# Patient Record
Sex: Male | Born: 1965 | Race: Black or African American | Hispanic: No | Marital: Married | Smoking: Never smoker
Health system: Southern US, Community
[De-identification: ages and names within clinical notes are randomized; demographics above are authoritative.]

## PROBLEM LIST (undated history)

## (undated) DIAGNOSIS — K449 Diaphragmatic hernia without obstruction or gangrene: Secondary | ICD-10-CM

---

## 2007-08-22 ENCOUNTER — Emergency Department (HOSPITAL_BASED_OUTPATIENT_CLINIC_OR_DEPARTMENT_OTHER): Admission: EM | Admit: 2007-08-22 | Discharge: 2007-08-22 | Payer: Self-pay | Admitting: Emergency Medicine

## 2010-11-16 LAB — CBC
HCT: 42.5
Hemoglobin: 13.5
MCHC: 31.7
MCV: 70.8 — ABNORMAL LOW
Platelets: 192
RBC: 6.01 — ABNORMAL HIGH
RDW: 13.1
WBC: 6

## 2010-11-16 LAB — BASIC METABOLIC PANEL
CO2: 28
GFR calc Af Amer: >60
Glucose, Bld: 99
Potassium: 3.9
Sodium: 141

## 2010-11-16 LAB — DIFFERENTIAL
Basophils Absolute: 0
Basophils Relative: 1
Eosinophils Absolute: 0.1
Eosinophils Relative: 2
Lymphocytes Relative: 59 — ABNORMAL HIGH
Lymphs Abs: 3.6
Monocytes Absolute: 0.6
Monocytes Relative: 10
Neutro Abs: 1.7
Neutrophils Relative %: 29 — ABNORMAL LOW

## 2010-11-16 LAB — BASIC METABOLIC PANEL WITH GFR
BUN: 12
Calcium: 9.5
Chloride: 103
Creatinine, Ser: 1.3
GFR calc non Af Amer: >60

## 2010-11-16 LAB — POCT CARDIAC MARKERS
CKMB, poc: 1.9
Myoglobin, poc: 61.2
Operator id: 5506
Troponin i, poc: <0.05

## 2013-02-27 ENCOUNTER — Encounter (HOSPITAL_BASED_OUTPATIENT_CLINIC_OR_DEPARTMENT_OTHER): Payer: Self-pay | Admitting: Emergency Medicine

## 2013-02-27 ENCOUNTER — Emergency Department (HOSPITAL_BASED_OUTPATIENT_CLINIC_OR_DEPARTMENT_OTHER)
Admission: EM | Admit: 2013-02-27 | Discharge: 2013-02-27 | Disposition: A | Discharge status: Home / Self Care | Payer: BC Managed Care – PPO | Attending: Emergency Medicine | Admitting: Emergency Medicine

## 2013-02-27 DIAGNOSIS — J029 Acute pharyngitis, unspecified: Secondary | ICD-10-CM

## 2013-02-27 DIAGNOSIS — J329 Chronic sinusitis, unspecified: Secondary | ICD-10-CM | POA: Insufficient documentation

## 2013-02-27 MED ORDER — AMOXICILLIN 500 MG PO CAPS: 500.0000 mg | ORAL_CAPSULE | Freq: Three times a day (TID) | ORAL | Status: AC

## 2013-02-27 NOTE — Discharge Instructions (Signed)
Viral and Bacterial Pharyngitis  Pharyngitis is soreness (inflammation) or infection of the pharynx. It is also called a sore throat.  CAUSES   Most sore throats are caused by viruses and are part of a cold. However, some sore throats are caused by strep and other bacteria. Sore throats can also be caused by post nasal drip from draining sinuses, allergies and sometimes from sleeping with an open mouth. Infectious sore throats can be spread from person to person by coughing, sneezing and sharing cups or eating utensils.  TREATMENT   Sore throats that are viral usually last 3-4 days. Viral illness will get better without medications (antibiotics). Strep throat and other bacterial infections will usually begin to get better about 24-48 hours after you begin to take antibiotics.  HOME CARE INSTRUCTIONS    If the caregiver feels there is a bacterial infection or if there is a positive strep test, they will prescribe an antibiotic. The full course of antibiotics must be taken. If the full course of antibiotic is not taken, you or your child may become ill again. If you or your child has strep throat and do not finish all of the medication, serious heart or kidney diseases may develop.   Drink enough water and fluids to keep your urine clear or pale yellow.   Only take over-the-counter or prescription medicines for pain, discomfort or fever as directed by your caregiver.   Get lots of rest.   Gargle with salt water ( tsp. of salt in a glass of water) as often as every 1-2 hours as you need for comfort.   Hard candies may soothe the throat if individual is not at risk for choking. Throat sprays or lozenges may also be used.  SEEK MEDICAL CARE IF:    Large, tender lumps in the neck develop.   A rash develops.   Green, yellow-brown or bloody sputum is coughed up.   Your baby is older than 3 months with a rectal temperature of 100.5 F (38.1 C) or higher for more than 1 day.  SEEK IMMEDIATE MEDICAL CARE IF:    A  stiff neck develops.   You or your child are drooling or unable to swallow liquids.   You or your child are vomiting, unable to keep medications or liquids down.   You or your child has severe pain, unrelieved with recommended medications.   You or your child are having difficulty breathing (not due to stuffy nose).   You or your child are unable to fully open your mouth.   You or your child develop redness, swelling, or severe pain anywhere on the neck.   You have a fever.   Your baby is older than 3 months with a rectal temperature of 102 F (38.9 C) or higher.   Your baby is 3 months old or younger with a rectal temperature of 100.4 F (38 C) or higher.  MAKE SURE YOU:    Understand these instructions.   Will watch your condition.   Will get help right away if you are not doing well or get worse.  Document Released: 02/05/2005 Document Revised: 04/30/2011 Document Reviewed: 05/05/2007  [employer] Patient Information 2014 [employer]  Sinusitis  Sinusitis is redness, soreness, and swelling (inflammation) of the paranasal sinuses. Paranasal sinuses are air pockets within the bones of your face (beneath the eyes, the middle of the forehead, or above the eyes). In healthy paranasal sinuses, mucus is able to drain out, and air is able to   circulate through them by way of your nose. However, when your paranasal sinuses are inflamed, mucus and air can become trapped. This can allow bacteria and other germs to grow and cause infection.  Sinusitis can develop quickly and last only a short time (acute) or continue over a long period (chronic). Sinusitis that lasts for more than 12 weeks is considered chronic.   CAUSES   Causes of sinusitis include:   Allergies.   Structural abnormalities, such as displacement of the cartilage that separates your nostrils (deviated septum), which can decrease the air flow through your nose and sinuses and affect sinus drainage.   Functional abnormalities, such as when the  small hairs (cilia) that line your sinuses and help remove mucus do not work properly or are not present.  SYMPTOMS   Symptoms of acute and chronic sinusitis are the same. The primary symptoms are pain and pressure around the affected sinuses. Other symptoms include:   Upper toothache.   Earache.   Headache.   Bad breath.   Decreased sense of smell and taste.   A cough, which worsens when you are lying flat.   Fatigue.   Fever.   Thick drainage from your nose, which often is green and may contain pus (purulent).   Swelling and warmth over the affected sinuses.  DIAGNOSIS   Your caregiver will perform a physical exam. During the exam, your caregiver may:   Look in your nose for signs of abnormal growths in your nostrils (nasal polyps).   Tap over the affected sinus to check for signs of infection.   View the inside of your sinuses (endoscopy) with a special imaging device with a light attached (endoscope), which is inserted into your sinuses.  If your caregiver suspects that you have chronic sinusitis, one or more of the following tests may be recommended:   Allergy tests.   Nasal culture A sample of mucus is taken from your nose and sent to a lab and screened for bacteria.   Nasal cytology A sample of mucus is taken from your nose and examined by your caregiver to determine if your sinusitis is related to an allergy.  TREATMENT   Most cases of acute sinusitis are related to a viral infection and will resolve on their own within 10 days. Sometimes medicines are prescribed to help relieve symptoms (pain medicine, decongestants, nasal steroid sprays, or saline sprays).   However, for sinusitis related to a bacterial infection, your caregiver will prescribe antibiotic medicines. These are medicines that will help kill the bacteria causing the infection.   Rarely, sinusitis is caused by a fungal infection. In theses cases, your caregiver will prescribe antifungal medicine.  For some cases of chronic  sinusitis, surgery is needed. Generally, these are cases in which sinusitis recurs more than 3 times per year, despite other treatments.  HOME CARE INSTRUCTIONS    Drink plenty of water. Water helps thin the mucus so your sinuses can drain more easily.   Use a humidifier.   Inhale steam 3 to 4 times a day (for example, sit in the bathroom with the shower running).   Apply a warm, moist washcloth to your face 3 to 4 times a day, or as directed by your caregiver.   Use saline nasal sprays to help moisten and clean your sinuses.   Take over-the-counter or prescription medicines for pain, discomfort, or fever only as directed by your caregiver.  SEEK IMMEDIATE MEDICAL CARE IF:   You have increasing pain or

## 2013-02-27 NOTE — ED Notes (Signed)
Pt amb to room 11 with quick steady gait in nad. Pt reports sore throat x Tuesday. Denies any cough or other c/o.

## 2013-02-27 NOTE — ED Provider Notes (Signed)
CSN: [id number]     Arrival date & time 02/27/13  0857 History   First MD Initiated Contact with Patient 02/27/13 0914     Chief Complaint  Patient presents with  . Sore Throat    HPI  Patient presents with a sore throat. Right sinus pressure and drainage last 2 days he states it is set up last night because of the drainage into his throat. Slight cough, no shortness of breath. Although he does cough at night he does not cough during the day. No fevers. No myalgias. No GI complaints. Not improving with over-the-counter medicines at home  History reviewed. No pertinent past medical history. History reviewed. No pertinent past surgical history. History reviewed. No pertinent family history. History  Substance Use Topics  . Smoking status: Never Smoker   . Smokeless tobacco: Not on file  . Alcohol Use: Not on file    Review of Systems  Constitutional: Negative for fever, chills, diaphoresis, appetite change and fatigue.  HENT: Positive for postnasal drip, rhinorrhea, sinus pressure and sore throat. Negative for ear pain, mouth sores and trouble swallowing.   Eyes: Negative for visual disturbance.  Respiratory: Negative for cough, chest tightness, shortness of breath and wheezing.   Cardiovascular: Negative for chest pain.  Gastrointestinal: Negative for nausea, vomiting, abdominal pain, diarrhea and abdominal distention.  Endocrine: Negative for polydipsia, polyphagia and polyuria.  Genitourinary: Negative for dysuria, frequency and hematuria.  Musculoskeletal: Negative for gait problem.  Skin: Negative for color change, pallor and rash.  Neurological: Negative for dizziness, syncope, light-headedness and headaches.  Hematological: Does not bruise/bleed easily.  Psychiatric/Behavioral: Negative for behavioral problems and confusion.    Allergies  Review of patient's allergies indicates no known allergies.  Home Medications   Current Outpatient Rx  Name  Route  Sig  Dispense   Refill  . amoxicillin (AMOXIL) 500 MG capsule   Oral   Take 1 capsule (500 mg total) by mouth 3 (three) times daily.   30 capsule   0    BP 152/99  Pulse 92  Temp(Src) 98.8 F (37.1 C) (Oral)  Resp 18  SpO2 100% Physical Exam  Constitutional: He is oriented to person, place, and time. He appears well-developed and well-nourished. No distress.  HENT:  Head: Normocephalic.  Tender over the right maxilla. Congestive nares. Slight exudate on the bilateral tonsils although no tonsillar hypertrophy. No adenopathy in the neck. Normal TMs.  Eyes: Conjunctivae are normal. Pupils are equal, round, and reactive to light. No scleral icterus.  Neck: Normal range of motion. Neck supple. No thyromegaly present.  Cardiovascular: Normal rate and regular rhythm.  Exam reveals no gallop and no friction rub.   No murmur heard. Pulmonary/Chest: Effort normal and breath sounds normal. No respiratory distress. He has no wheezes. He has no rales.  Abdominal: Soft. Bowel sounds are normal. He exhibits no distension. There is no tenderness. There is no rebound.  Musculoskeletal: Normal range of motion.  Neurological: He is alert and oriented to person, place, and time.  Skin: Skin is warm and dry. No rash noted.  Psychiatric: He has a normal mood and affect. His behavior is normal.    ED Course  Procedures (including critical care time) Labs Review Labs Reviewed - No data to display Imaging Review No results found.  EKG Interpretation   None       MDM   1. Pharyngitis   2. Sinusitis    Clinical symptoms consistent with a sinusitis. He does have exudate  in his throat. Plan will be pan covered with amoxicillin. Continue antihistamines for drainage.    Rolland Porter, MD 02/27/13 [phone]

## 2014-10-25 ENCOUNTER — Encounter (HOSPITAL_BASED_OUTPATIENT_CLINIC_OR_DEPARTMENT_OTHER): Payer: Self-pay | Admitting: *Deleted

## 2014-10-25 ENCOUNTER — Emergency Department (HOSPITAL_BASED_OUTPATIENT_CLINIC_OR_DEPARTMENT_OTHER)
Admission: EM | Admit: 2014-10-25 | Discharge: 2014-10-25 | Disposition: A | Discharge status: Home / Self Care | Payer: Managed Care, Other (non HMO) | Attending: Emergency Medicine | Admitting: Emergency Medicine

## 2014-10-25 ENCOUNTER — Emergency Department (HOSPITAL_BASED_OUTPATIENT_CLINIC_OR_DEPARTMENT_OTHER): Payer: Managed Care, Other (non HMO)

## 2014-10-25 ENCOUNTER — Other Ambulatory Visit: Payer: Self-pay

## 2014-10-25 DIAGNOSIS — R0789 Other chest pain: Secondary | ICD-10-CM | POA: Diagnosis not present

## 2014-10-25 DIAGNOSIS — R000 Tachycardia, unspecified: Secondary | ICD-10-CM | POA: Diagnosis not present

## 2014-10-25 DIAGNOSIS — Z8719 Personal history of other diseases of the digestive system: Secondary | ICD-10-CM | POA: Insufficient documentation

## 2014-10-25 DIAGNOSIS — Z79899 Other long term (current) drug therapy: Secondary | ICD-10-CM | POA: Insufficient documentation

## 2014-10-25 DIAGNOSIS — R079 Chest pain, unspecified: Secondary | ICD-10-CM | POA: Diagnosis present

## 2014-10-25 HISTORY — DX: Diaphragmatic hernia without obstruction or gangrene: K44.9

## 2014-10-25 LAB — CBC WITH DIFFERENTIAL/PLATELET
BASOS ABS: 0 10*3/uL (ref 0.0–0.1)
BASOS PCT: 0 % (ref 0–1)
EOS ABS: 0.1 10*3/uL (ref 0.0–0.7)
Eosinophils Relative: 2 % (ref 0–5)
HEMATOCRIT: 43.1 % (ref 39.0–52.0)
HEMOGLOBIN: 14.3 g/dL (ref 13.0–17.0)
Lymphocytes Relative: 55 % — ABNORMAL HIGH (ref 12–46)
Lymphs Abs: 3.6 10*3/uL (ref 0.7–4.0)
MCH: 22.3 pg — AB (ref 26.0–34.0)
MCHC: 33.2 g/dL (ref 30.0–36.0)
MCV: 67.3 fL — AB (ref 78.0–100.0)
MONO ABS: 0.8 10*3/uL (ref 0.1–1.0)
Monocytes Relative: 12 % (ref 3–12)
NEUTROS ABS: 2 10*3/uL (ref 1.7–7.7)
Neutrophils Relative %: 31 % — ABNORMAL LOW (ref 43–77)
Platelets: 225 10*3/uL (ref 150–400)
RBC: 6.4 MIL/uL — ABNORMAL HIGH (ref 4.22–5.81)
RDW: 15.2 % (ref 11.5–15.5)
WBC: 6.5 10*3/uL (ref 4.0–10.5)

## 2014-10-25 LAB — COMPREHENSIVE METABOLIC PANEL
ALK PHOS: 75 U/L (ref 38–126)
ALT: 41 U/L (ref 17–63)
AST: 28 U/L (ref 15–41)
Albumin: 4.7 g/dL (ref 3.5–5.0)
Anion gap: 11 (ref 5–15)
BILIRUBIN TOTAL: 0.6 mg/dL (ref 0.3–1.2)
BUN: 17 mg/dL (ref 6–20)
CALCIUM: 9.9 mg/dL (ref 8.9–10.3)
CO2: 29 mmol/L (ref 22–32)
CREATININE: 1.59 mg/dL — AB (ref 0.61–1.24)
Chloride: 98 mmol/L — ABNORMAL LOW (ref 101–111)
GFR, EST AFRICAN AMERICAN: 57 mL/min — AB (ref >60)
GFR, EST NON AFRICAN AMERICAN: 49 mL/min — AB (ref >60)
Glucose, Bld: 114 mg/dL — ABNORMAL HIGH (ref 65–99)
Potassium: 3.2 mmol/L — ABNORMAL LOW (ref 3.5–5.1)
Sodium: 138 mmol/L (ref 135–145)
Total Protein: 8.6 g/dL — ABNORMAL HIGH (ref 6.5–8.1)

## 2014-10-25 LAB — TROPONIN I: Troponin I: <0.03 ng/mL (ref <0.031)

## 2014-10-25 LAB — D-DIMER, QUANTITATIVE (NOT AT ARMC)

## 2014-10-25 MED ORDER — NAPROXEN 500 MG PO TABS: 500.0000 mg | ORAL_TABLET | Freq: Two times a day (BID) | ORAL | Status: DC

## 2014-10-25 NOTE — ED Provider Notes (Signed)
CSN: [id number]     Arrival date & time 10/25/14  5284 History   This chart was scribed for Paul Octave, MD by Murriel Hopper, ED Scribe. This patient was seen in room MH09/MH09 and the patient's care was started at 7:17 PM.  Chief Complaint  Patient presents with  . Chest Pain     The history is provided by the patient. No language interpreter was used.   HPI Comments: Paul Mercer is a 49 y.o. male who presents to the Emergency Department complaining of intermittent central chest pain that has been present for a few days. Pt states that when he moves he begins to have pain and states that it lasts for a minute or two. Pt denies SOB, nausea, vomiting. Pt denies taking any medications for his symptoms. Pt denies hx of heart problems, recent travel, hx of MI. Pt states he had a stress test for his heart 7-8 years ago.    Past Medical History  Diagnosis Date  . Hiatal hernia    History reviewed. No pertinent past surgical history. History reviewed. No pertinent family history. Social History  Substance Use Topics  . Smoking status: Never Smoker   . Smokeless tobacco: None  . Alcohol Use: No    Review of Systems  A complete 10 system review of systems was obtained and all systems are negative except as noted in the HPI and PMH.    Allergies  Review of patient's allergies indicates no known allergies.  Home Medications   Prior to Admission medications   Medication Sig Start Date End Date Taking? Authorizing Provider  chlorthalidone (HYGROTON) 25 MG tablet Take 25 mg by mouth daily.   Yes Historical Provider, MD  naproxen (NAPROSYN) 500 MG tablet Take 1 tablet (500 mg total) by mouth 2 (two) times daily. 10/25/14   Paul Octave, MD   BP 128/88 mmHg  Pulse 86  Temp(Src) 98.7 F (37.1 C) (Oral)  Resp 18  Ht 5\' 10"  (1.778 m)  Wt 230 lb (104.327 kg)  BMI 33.00 kg/m2  SpO2 98% Physical Exam  Constitutional: He is oriented to person, place, and time. He appears  well-developed and well-nourished. No distress.  HENT:  Head: Normocephalic and atraumatic.  Mouth/Throat: Oropharynx is clear and moist. No oropharyngeal exudate.  Eyes: Conjunctivae and EOM are normal. Pupils are equal, round, and reactive to light.  Neck: Normal range of motion. Neck supple.  No meningismus.  Cardiovascular: Regular rhythm, normal heart sounds and intact distal pulses.   No murmur heard. Tachycardic   Pulmonary/Chest: Effort normal and breath sounds normal. No respiratory distress.  Abdominal: Soft. There is no tenderness. There is no rebound and no guarding.  Musculoskeletal: Normal range of motion. He exhibits no edema or tenderness.  Reproducible chest wall tenderness to left sternal mid-junction that doesn't worsen with arm movement    Neurological: He is alert and oriented to person, place, and time. No cranial nerve deficit. He exhibits normal muscle tone. Coordination normal.  No ataxia on finger to nose bilaterally. No pronator drift. 5/5 strength throughout. CN 2-12 intact. Negative Romberg. Equal grip strength. Sensation intact. Gait is normal.   Skin: Skin is warm.  Psychiatric: He has a normal mood and affect. His behavior is normal.  Nursing note and vitals reviewed.   ED Course  Procedures (including critical care time)  DIAGNOSTIC STUDIES: Oxygen Saturation is 99% on room air, normal by my interpretation.    COORDINATION OF CARE: 7:22 PM Discussed treatment plan with  pt at bedside and pt agreed to plan.   Labs Review Labs Reviewed  CBC WITH DIFFERENTIAL/PLATELET - Abnormal; Notable for the following:    RBC 6.40 (*)    MCV 67.3 (*)    MCH 22.3 (*)    Neutrophils Relative % 31 (*)    Lymphocytes Relative 55 (*)    All other components within normal limits  COMPREHENSIVE METABOLIC PANEL - Abnormal; Notable for the following:    Potassium 3.2 (*)    Chloride 98 (*)    Glucose, Bld 114 (*)    Creatinine, Ser 1.59 (*)    Total Protein 8.6  (*)    GFR calc non Af Amer 49 (*)    GFR calc Af Amer 57 (*)    All other components within normal limits  TROPONIN I  D-DIMER, QUANTITATIVE (NOT AT [facility])  TROPONIN I    Imaging Review Dg Chest 2 View  10/25/2014   CLINICAL DATA:  Chest pain for several days.  Initial encounter  EXAM: CHEST  2 VIEW  COMPARISON:  None.  FINDINGS: Normal mediastinum and cardiac silhouette. Normal pulmonary vasculature. No evidence of effusion, infiltrate, or pneumothorax. No acute bony abnormality.  IMPRESSION: No acute cardiopulmonary process.   Electronically Signed   By: Genevive Bi M.D.   On: 10/25/2014 20:34   I have personally reviewed and evaluated these images and lab results as part of my medical decision-making.   EKG Interpretation   Date/Time:  Monday October 25 2014 19:01:31 EDT Ventricular Rate:  115 PR Interval:  148 QRS Duration: 86 QT Interval:  306 QTC Calculation: 423 R Axis:   71 Text Interpretation:  Sinus tachycardia Nonspecific T wave abnormality  Abnormal ECG Nonspecific ST abnormality Confirmed by Manus Gunning  MD, Laverle Pillard  ([id number]) on 10/25/2014 7:07:24 PM      MDM   Final diagnoses:  Atypical chest pain   3 days of intermittent left-sided chest pain lasting a few seconds at a time. Worse with palpation and movement. No shortness of breath, nausea, vomiting, cough or fever.  EKG with nonspecific ST changes. Patient reports negative stress test about 8 years ago.  Pain reproducible and atypical for ACS. Troponin is negative. D-dimer is negative. Cr slightly worse than previous.  Troponin negative 2. Chest x-ray negative. Pain is intermittent and reproducible and worse with movement and palpation. Low suspicion for ACS.  Patient would benefit from outpatient stress test and referred to cardiology. Doubt ACS, PE, aortic dissection, pneumothorax or pneumonia.  I personally performed the services described in this documentation, which was scribed in my presence. The  recorded information has been reviewed and is accurate.    Paul Octave, MD 10/25/14 [phone]

## 2014-10-25 NOTE — Discharge Instructions (Signed)
Chest Pain (Nonspecific) There is no evidence of heart attack or blood clot in the lung. Followup with the cardiologist for a stress test. Return to the ED if you develop new or worsening symptoms. It is often hard to give a specific diagnosis for the cause of chest pain. There is always a chance that your pain could be related to something serious, such as a heart attack or a blood clot in the lungs. You need to follow up with your health care provider for further evaluation. CAUSES   Heartburn.  Pneumonia or bronchitis.  Anxiety or stress.  Inflammation around your heart (pericarditis) or lung (pleuritis or pleurisy).  A blood clot in the lung.  A collapsed lung (pneumothorax). It can develop suddenly on its own (spontaneous pneumothorax) or from trauma to the chest.  Shingles infection (herpes zoster virus). The chest wall is composed of bones, muscles, and cartilage. Any of these can be the source of the pain.  The bones can be bruised by injury.  The muscles or cartilage can be strained by coughing or overwork.  The cartilage can be affected by inflammation and become sore (costochondritis). DIAGNOSIS  Lab tests or other studies may be needed to find the cause of your pain. Your health care provider may have you take a test called an ambulatory electrocardiogram (ECG). An ECG records your heartbeat patterns over a 24-hour period. You may also have other tests, such as:  Transthoracic echocardiogram (TTE). During echocardiography, sound waves are used to evaluate how blood flows through your heart.  Transesophageal echocardiogram (TEE).  Cardiac monitoring. This allows your health care provider to monitor your heart rate and rhythm in real time.  Holter monitor. This is a portable device that records your heartbeat and can help diagnose heart arrhythmias. It allows your health care provider to track your heart activity for several days, if needed.  Stress tests by exercise or  by giving medicine that makes the heart beat faster. TREATMENT   Treatment depends on what may be causing your chest pain. Treatment may include:  Acid blockers for heartburn.  Anti-inflammatory medicine.  Pain medicine for inflammatory conditions.  Antibiotics if an infection is present.  You may be advised to change lifestyle habits. This includes stopping smoking and avoiding alcohol, caffeine, and chocolate.  You may be advised to keep your head raised (elevated) when sleeping. This reduces the chance of acid going backward from your stomach into your esophagus. Most of the time, nonspecific chest pain will improve within 2-3 days with rest and mild pain medicine.  HOME CARE INSTRUCTIONS   If antibiotics were prescribed, take them as directed. Finish them even if you start to feel better.  For the next few days, avoid physical activities that bring on chest pain. Continue physical activities as directed.  Do not use any tobacco products, including cigarettes, chewing tobacco, or electronic cigarettes.  Avoid drinking alcohol.  Only take medicine as directed by your health care provider.  Follow your health care provider's suggestions for further testing if your chest pain does not go away.  Keep any follow-up appointments you made. If you do not go to an appointment, you could develop lasting (chronic) problems with pain. If there is any problem keeping an appointment, call to reschedule. SEEK MEDICAL CARE IF:   Your chest pain does not go away, even after treatment.  You have a rash with blisters on your chest.  You have a fever. SEEK IMMEDIATE MEDICAL CARE IF:  You have increased chest pain or pain that spreads to your arm, neck, jaw, back, or abdomen.  You have shortness of breath.  You have an increasing cough, or you cough up blood.  You have severe back or abdominal pain.  You feel nauseous or vomit.  You have severe weakness.  You faint.  You have  chills. This is an emergency. Do not wait to see if the pain will go away. Get medical help at once. Call your local emergency services (911 in [place] Do not drive yourself to the hospital. MAKE SURE YOU:   Understand these instructions.  Will watch your condition.  Will get help right away if you are not doing well or get worse. Document Released: 11/15/2004 Document Revised: 02/10/2013 Document Reviewed: 09/11/2007 [facility] Patient Information 2015 [place], [place]. This information is not intended to replace advice given to you by your health care provider. Make sure you discuss any questions you have with your health care provider.

## 2014-10-25 NOTE — ED Notes (Signed)
Pt reports intermittent substernal CP x a couple days.  Denies SOB, N/V.  Pt is tender on palpation of his sternum

## 2014-10-25 NOTE — ED Notes (Signed)
C/op mid sternal cp off and onx 2-3 days  Increased w movement,  Denies n/v/d,  No sob

## 2014-11-29 ENCOUNTER — Telehealth: Payer: Self-pay | Admitting: Cardiology

## 2014-11-29 NOTE — Progress Notes (Signed)
     HPI: 49 year old male for evaluation of chest pain. Seen recently in the emergency room with complaints of chest pain. Chest x-ray and troponins negative. Hemoglobin normal but MCV 67.3. Relative lymphocytosis. Potassium 3.2. Creatinine 1.59. D-dimer normal.  Current Outpatient Prescriptions  Medication Sig Dispense Refill  . chlorthalidone (HYGROTON) 25 MG tablet Take 25 mg by mouth daily.    . naproxen (NAPROSYN) 500 MG tablet Take 1 tablet (500 mg total) by mouth 2 (two) times daily. 30 tablet 0   No current facility-administered medications for this visit.    No Known Allergies  Past Medical History  Diagnosis Date  . Hiatal hernia     No past surgical history on file.  Social History   Social History  . Marital Status: Married    Spouse Name: N/A  . Number of Children: N/A  . Years of Education: N/A   Occupational History  . Not on file.   Social History Main Topics  . Smoking status: Never Smoker   . Smokeless tobacco: Not on file  . Alcohol Use: No  . Drug Use: No  . Sexual Activity: Not on file   Other Topics Concern  . Not on file   Social History Narrative    No family history on file.  ROS: no fevers or chills, productive cough, hemoptysis, dysphasia, odynophagia, melena, hematochezia, dysuria, hematuria, rash, seizure activity, orthopnea, PND, pedal edema, claudication. Remaining systems are negative.  Physical Exam:   There were no vitals taken for this visit.  General:  Well developed/well nourished in NAD Skin warm/dry Patient not depressed No peripheral clubbing Back-normal HEENT-normal/normal eyelids Neck supple/normal carotid upstroke bilaterally; no bruits; no JVD; no thyromegaly chest - CTA/ normal expansion CV - RRR/normal S1 and S2; no murmurs, rubs or gallops;  PMI nondisplaced Abdomen -NT/ND, no HSM, no mass, + bowel sounds, no bruit 2+ femoral pulses, no bruits Ext-no edema, chords, 2+ DP Neuro-grossly nonfocal  ECG  10/25/2014-sinus tachycardia, nonspecific ST changes.   This encounter was created in error - please disregard.

## 2014-11-30 NOTE — Telephone Encounter (Signed)
Close encounter 

## 2014-12-01 ENCOUNTER — Encounter: Payer: Managed Care, Other (non HMO) | Admitting: Cardiology

## 2015-01-25 NOTE — Progress Notes (Signed)
     HPI: 49 year old male for evaluation of chest pain. Seen in the emergency room in September with chest pain. Chest x-ray and troponin negative. Hemoglobin 14.3 with MCV 67.3. D-dimer negative.  Current Outpatient Prescriptions  Medication Sig Dispense Refill  . chlorthalidone (HYGROTON) 25 MG tablet Take 25 mg by mouth daily.    . naproxen (NAPROSYN) 500 MG tablet Take 1 tablet (500 mg total) by mouth 2 (two) times daily. 30 tablet 0   No current facility-administered medications for this visit.    No Known Allergies  Past Medical History  Diagnosis Date  . Hiatal hernia     No past surgical history on file.  Social History   Social History  . Marital Status: Married    Spouse Name: N/A  . Number of Children: N/A  . Years of Education: N/A   Occupational History  . Not on file.   Social History Main Topics  . Smoking status: Never Smoker   . Smokeless tobacco: Not on file  . Alcohol Use: No  . Drug Use: No  . Sexual Activity: Not on file   Other Topics Concern  . Not on file   Social History Narrative    No family history on file.  ROS: no fevers or chills, productive cough, hemoptysis, dysphasia, odynophagia, melena, hematochezia, dysuria, hematuria, rash, seizure activity, orthopnea, PND, pedal edema, claudication. Remaining systems are negative.  Physical Exam:   There were no vitals taken for this visit.  General:  Well developed/well nourished in NAD Skin warm/dry Patient not depressed No peripheral clubbing Back-normal HEENT-normal/normal eyelids Neck supple/normal carotid upstroke bilaterally; no bruits; no JVD; no thyromegaly chest - CTA/ normal expansion CV - RRR/normal S1 and S2; no murmurs, rubs or gallops;  PMI nondisplaced Abdomen -NT/ND, no HSM, no mass, + bowel sounds, no bruit 2+ femoral pulses, no bruits Ext-no edema, chords, 2+ DP Neuro-grossly nonfocal  ECG 10/25/2014-sinus tachycardia with nonspecific ST changes.   This  encounter was created in error - please disregard.

## 2015-01-26 ENCOUNTER — Encounter: Payer: Managed Care, Other (non HMO) | Admitting: Cardiology

## 2015-01-26 DIAGNOSIS — R0989 Other specified symptoms and signs involving the circulatory and respiratory systems: Secondary | ICD-10-CM

## 2016-05-11 IMAGING — DX DG CHEST 2V
2 series · 2 of 2 positions shown · non-contrast
Comparison: None.

CLINICAL DATA: Chest pain for several days.  Initial encounter

EXAM:
CHEST  2 VIEW

[chest pa]
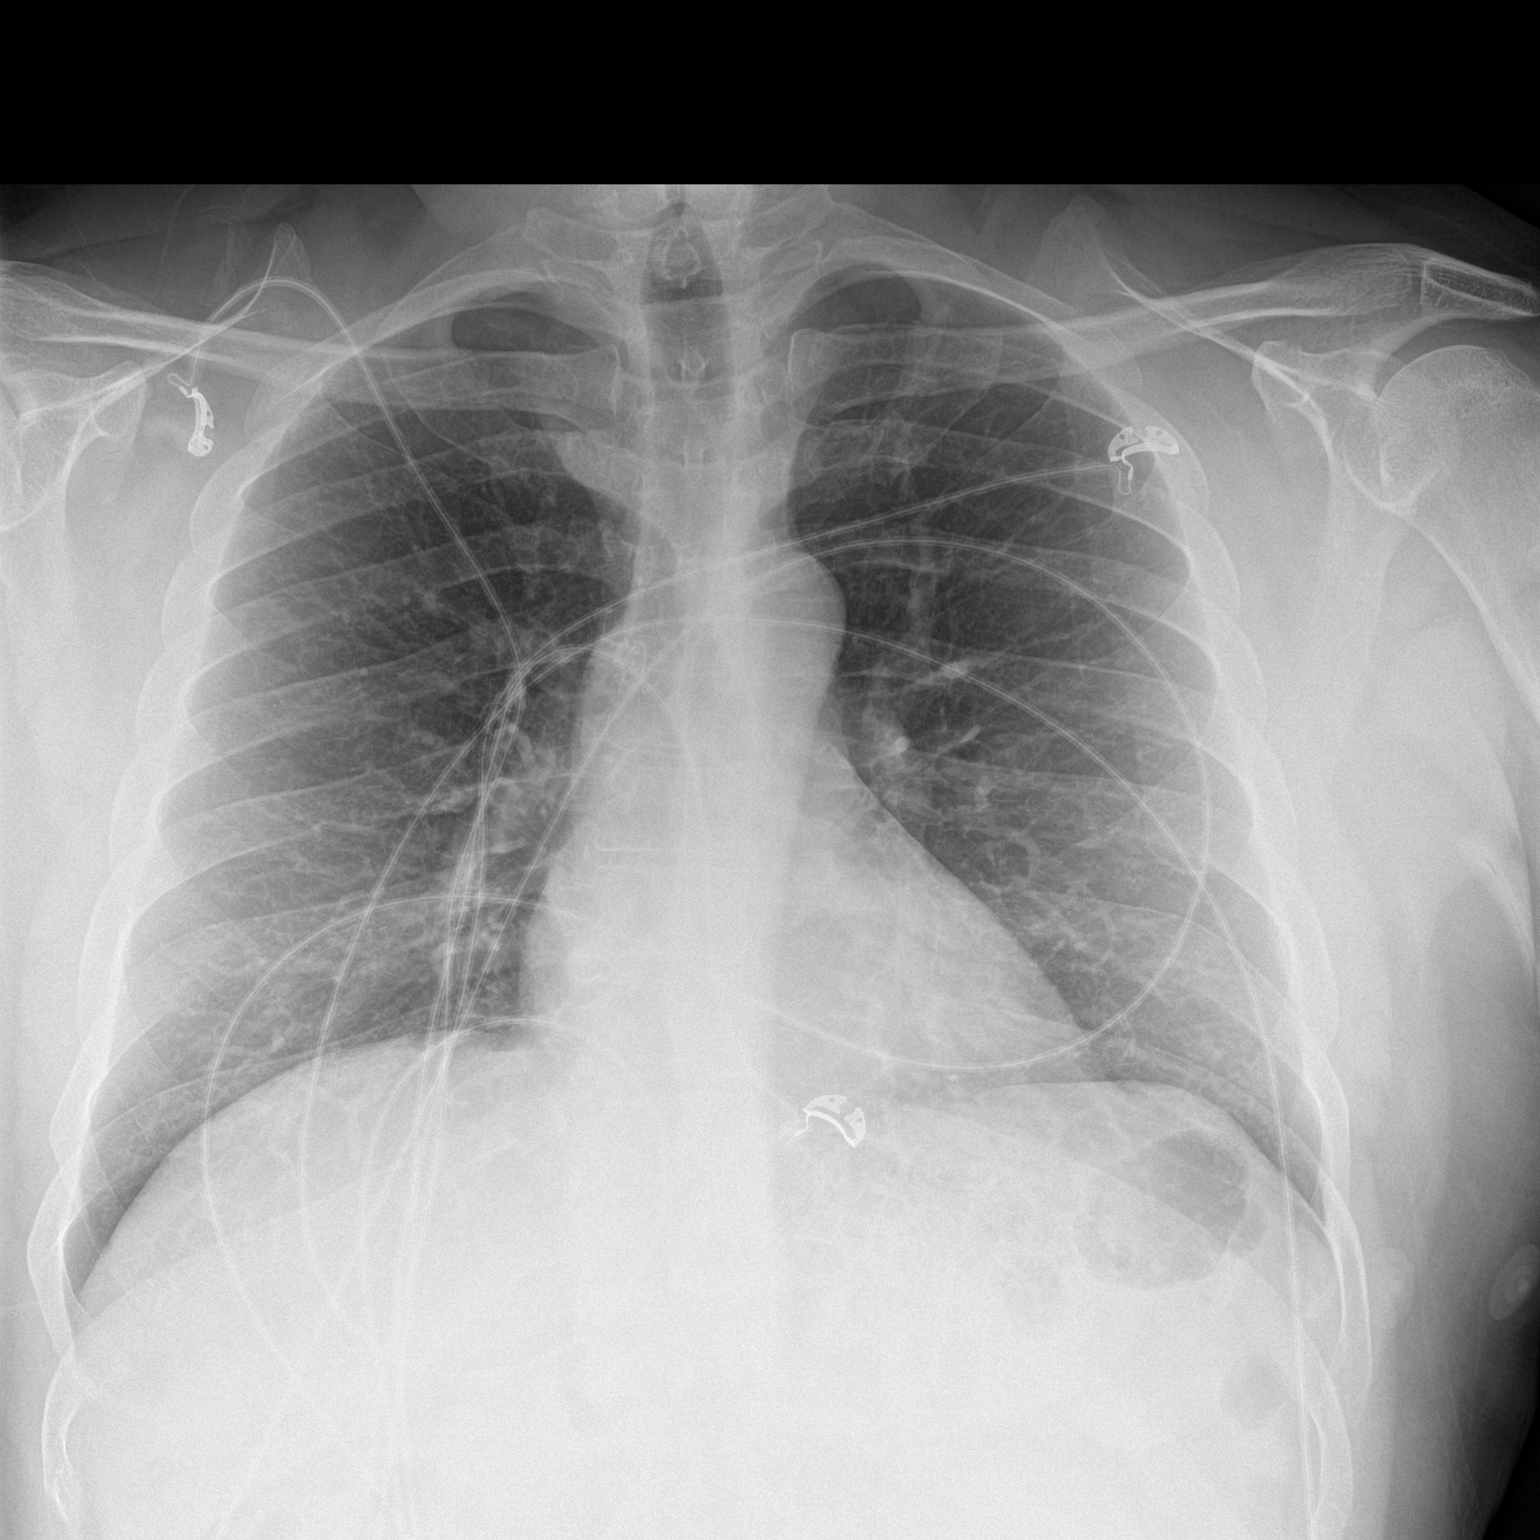

[chest lat]
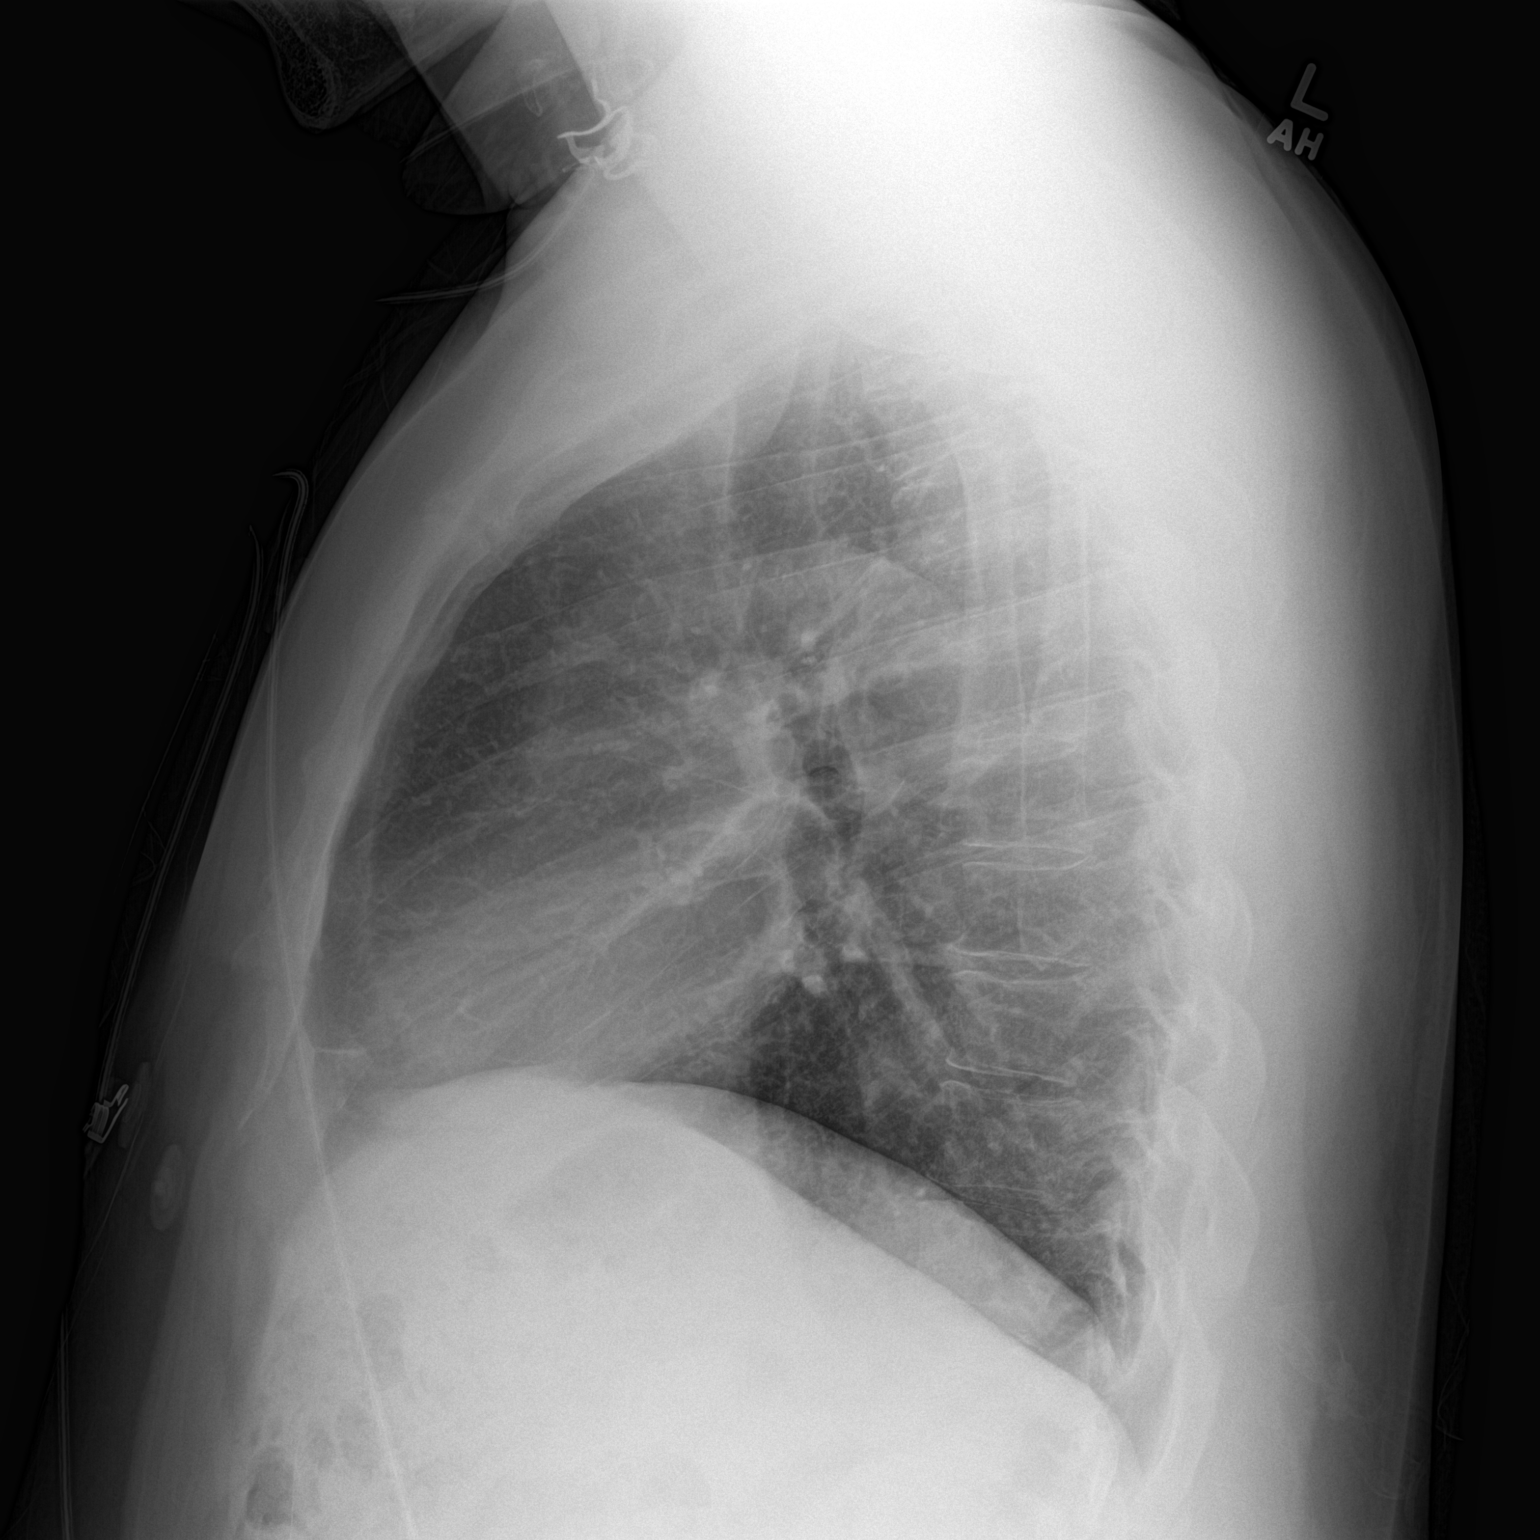

[2 of 2 positions shown; findings below may reference images not displayed]

FINDINGS: Normal mediastinum and cardiac silhouette. Normal pulmonary
vasculature. No evidence of effusion, infiltrate, or pneumothorax.
No acute bony abnormality.
IMPRESSION: No acute cardiopulmonary process.

## 2022-04-24 ENCOUNTER — Emergency Department (HOSPITAL_BASED_OUTPATIENT_CLINIC_OR_DEPARTMENT_OTHER): Payer: 59

## 2022-04-24 ENCOUNTER — Emergency Department (HOSPITAL_BASED_OUTPATIENT_CLINIC_OR_DEPARTMENT_OTHER)
Admission: EM | Admit: 2022-04-24 | Discharge: 2022-04-24 | Disposition: A | Discharge status: Home / Self Care | Payer: 59 | Attending: Emergency Medicine | Admitting: Emergency Medicine

## 2022-04-24 ENCOUNTER — Encounter (HOSPITAL_BASED_OUTPATIENT_CLINIC_OR_DEPARTMENT_OTHER): Payer: Self-pay | Admitting: *Deleted

## 2022-04-24 ENCOUNTER — Other Ambulatory Visit: Payer: Self-pay

## 2022-04-24 DIAGNOSIS — R0789 Other chest pain: Secondary | ICD-10-CM | POA: Insufficient documentation

## 2022-04-24 DIAGNOSIS — R42 Dizziness and giddiness: Secondary | ICD-10-CM | POA: Diagnosis not present

## 2022-04-24 DIAGNOSIS — R000 Tachycardia, unspecified: Secondary | ICD-10-CM | POA: Diagnosis not present

## 2022-04-24 LAB — D-DIMER, QUANTITATIVE: D-Dimer, Quant: 0.27 ug/mL-FEU (ref 0.00–0.50)

## 2022-04-24 LAB — CBC WITH DIFFERENTIAL/PLATELET
Abs Immature Granulocytes: 0.03 10*3/uL (ref 0.00–0.07)
Basophils Absolute: 0 10*3/uL (ref 0.0–0.1)
Basophils Relative: 0 %
Eosinophils Absolute: 0.1 10*3/uL (ref 0.0–0.5)
Eosinophils Relative: 1 %
HCT: 44.6 % (ref 39.0–52.0)
Hemoglobin: 13.9 g/dL (ref 13.0–17.0)
Immature Granulocytes: 0 %
Lymphocytes Relative: 16 %
Lymphs Abs: 1.5 10*3/uL (ref 0.7–4.0)
MCH: 22.2 pg — ABNORMAL LOW (ref 26.0–34.0)
MCHC: 31.2 g/dL (ref 30.0–36.0)
MCV: 71.4 fL — ABNORMAL LOW (ref 80.0–100.0)
Monocytes Absolute: 0.6 10*3/uL (ref 0.1–1.0)
Monocytes Relative: 6 %
Neutro Abs: 7 10*3/uL (ref 1.7–7.7)
Neutrophils Relative %: 77 %
Platelets: 221 10*3/uL (ref 150–400)
RBC: 6.25 MIL/uL — ABNORMAL HIGH (ref 4.22–5.81)
RDW: 13.7 % (ref 11.5–15.5)
WBC: 9.2 10*3/uL (ref 4.0–10.5)
nRBC: 0 % (ref 0.0–0.2)

## 2022-04-24 LAB — COMPREHENSIVE METABOLIC PANEL
ALT: 46 U/L — ABNORMAL HIGH (ref 0–44)
AST: 34 U/L (ref 15–41)
Albumin: 4.3 g/dL (ref 3.5–5.0)
Alkaline Phosphatase: 83 U/L (ref 38–126)
Anion gap: 7 (ref 5–15)
BUN: 24 mg/dL — ABNORMAL HIGH (ref 6–20)
CO2: 23 mmol/L (ref 22–32)
Calcium: 8.9 mg/dL (ref 8.9–10.3)
Chloride: 106 mmol/L (ref 98–111)
Creatinine, Ser: 1.36 mg/dL — ABNORMAL HIGH (ref 0.61–1.24)
GFR, Estimated: >60 mL/min (ref >60)
Glucose, Bld: 114 mg/dL — ABNORMAL HIGH (ref 70–99)
Potassium: 3.8 mmol/L (ref 3.5–5.1)
Sodium: 136 mmol/L (ref 135–145)
Total Bilirubin: 1 mg/dL (ref 0.3–1.2)
Total Protein: 8.4 g/dL — ABNORMAL HIGH (ref 6.5–8.1)

## 2022-04-24 LAB — TROPONIN I (HIGH SENSITIVITY): Troponin I (High Sensitivity): 8 ng/L (ref <18)

## 2022-04-24 LAB — LIPASE, BLOOD: Lipase: 47 U/L (ref 11–51)

## 2022-04-24 MED ORDER — IBUPROFEN 800 MG PO TABS
800.0000 mg | ORAL_TABLET | Freq: Once | ORAL | Status: AC
  Administered 2022-04-24: 800 mg via ORAL
  Filled 2022-04-24: qty 1

## 2022-04-24 MED ORDER — IBUPROFEN 800 MG PO TABS: 800.0000 mg | ORAL_TABLET | Freq: Three times a day (TID) | ORAL | 0 refills | Status: AC

## 2022-04-24 NOTE — ED Provider Notes (Addendum)
[facility] [place] Provider Note   CSN: [id number] Arrival date & time: 04/24/22  [id number]     History  Chief Complaint  Patient presents with   Chest Pain    Paul Mercer is a 57 y.o. male.   Chest Pain    57 year old male presenting to the emergency department with chest pain.  The patient states that a week and a half ago he had been doing manual labor taking a train and he developed chest discomfort substernally radiating to his left shoulder and back.  He states that the pain resolved.  It been worse with movement.  3 to 4 days ago he noticed a repeat onset of chest discomfort.  He states that it has been worse with movement, worse with taking a deep breath.  He endorses chest wall discomfort along the left side of his chest wall.  This morning he had an episode of lightheadedness and sweatiness in the setting of his pain and decided to come to the emergency department for further evaluation.  He states that he has a history of reflux and a hiatal hernia and this feels different.  Home Medications Prior to Admission medications   Medication Sig Start Date End Date Taking? Authorizing Provider  chlorthalidone (HYGROTON) 25 MG tablet Take 25 mg by mouth daily.    [provider]  naproxen (NAPROSYN) 500 MG tablet Take 1 tablet (500 mg total) by mouth 2 (two) times daily. 10/25/14   Ezequiel Essex, MD      Allergies    Patient has no known allergies.    Review of Systems   Review of Systems  Cardiovascular:  Positive for chest pain.  All other systems reviewed and are negative.   Physical Exam Updated Vital Signs BP (!) 157/97 (BP Location: Right Arm)   Pulse (!) 104   Temp 99.5 F (37.5 C) (Oral)   Resp 18   Ht '5\' 10"'$  (1.778 m)   Wt 104.3 kg   SpO2 100%   BMI 33.00 kg/m  Physical Exam Vitals and nursing note reviewed.  Constitutional:      General: He is not in acute distress.    Appearance: He is  well-developed.  HENT:     Head: Normocephalic and atraumatic.  Eyes:     Conjunctiva/sclera: Conjunctivae normal.  Cardiovascular:     Rate and Rhythm: Normal rate and regular rhythm.     Heart sounds: No murmur heard. Pulmonary:     Effort: Pulmonary effort is normal. No respiratory distress.     Breath sounds: Normal breath sounds.  Chest:     Chest wall: Tenderness present.     Comments: Left-sided chest wall tenderness to palpation that reproduces the patient's discomfort Abdominal:     Palpations: Abdomen is soft.     Tenderness: There is no abdominal tenderness.  Musculoskeletal:        General: No swelling.     Cervical back: Neck supple.  Skin:    General: Skin is warm and dry.     Capillary Refill: Capillary refill takes less than 2 seconds.  Neurological:     Mental Status: He is alert.  Psychiatric:        Mood and Affect: Mood normal.     ED Results / Procedures / Treatments   Labs (all labs ordered are listed, but only abnormal results are displayed) Labs Reviewed  CBC WITH DIFFERENTIAL/PLATELET - Abnormal; Notable for the following components:  Result Value   RBC 6.25 (*)    MCV 71.4 (*)    MCH 22.2 (*)    All other components within normal limits  COMPREHENSIVE METABOLIC PANEL - Abnormal; Notable for the following components:   Glucose, Bld 114 (*)    BUN 24 (*)    Creatinine, Ser 1.36 (*)    Total Protein 8.4 (*)    ALT 46 (*)    All other components within normal limits  LIPASE, BLOOD  D-DIMER, QUANTITATIVE  TROPONIN I (HIGH SENSITIVITY)    EKG EKG Interpretation  Date/Time:  Tuesday April 24 2022 07:23:48 EST Ventricular Rate:  111 PR Interval:  143 QRS Duration: 64 QT Interval:  359 QTC Calculation: 488 R Axis:   84 Text Interpretation: Sinus tachycardia Borderline abnrm T, anterolateral leads Borderline prolonged QT interval Confirmed by Regan Lemming ([id number]) on 04/24/2022 7:40:35 AM  Radiology DG Chest 2 View  Result Date:  04/24/2022 CLINICAL DATA:  CP EXAM: CHEST - 2 VIEW COMPARISON:  10/25/2014 FINDINGS: Cardiac silhouette is unremarkable. No pneumothorax or pleural effusion. The lungs are clear. The visualized skeletal structures are unremarkable. IMPRESSION: No acute cardiopulmonary process. Electronically Signed   By: Sammie Bench M.D.   On: 04/24/2022 07:56    Procedures Procedures    Medications Ordered in ED Medications  ibuprofen (ADVIL) tablet 800 mg (has no administration in time range)    ED Course/ Medical Decision Making/ A&P             HEART Score: 3                Medical Decision Making Amount and/or Complexity of Data Reviewed Labs: ordered. Radiology: ordered.  Risk Prescription drug management.    57 year old male presenting to the emergency department with chest pain.  The patient states that a week and a half ago he had been doing manual labor taking a train and he developed chest discomfort substernally radiating to his left shoulder and back.  He states that the pain resolved.  It been worse with movement.  3 to 4 days ago he noticed a repeat onset of chest discomfort.  He states that it has been worse with movement, worse with taking a deep breath.  He endorses chest wall discomfort along the left side of his chest wall.  This morning he had an episode of lightheadedness and sweatiness in the setting of his pain and decided to come to the emergency department for further evaluation.  He states that he has a history of reflux and a hiatal hernia and this feels different.   Vitals and telemetry on arrival: Afebrile, temperature 99.5, tachycardic heart rate 104, RR 18, BP 157/97, saturating 100% on room air  Pertinent exam findings include: Left-sided chest wall tenderness palpation that reproduces the patient's pain.  Lungs clear to auscultation bilaterally, no lower extremity swelling.  Differential diagnosis includes: Chest wall musculoskeletal pain/costochondritis, less  likely ACS, pneumonia, pneumothorax, pulmonary embolism,pericarditis/myocarditis, GERD, PUD. HEART score of 3, low risk.  EKG: Sinus tachycardia, rate 111 and no evidence of acute ischemic changes, abnormal intervals, or dysrhythmia. No concerning change from prior  Lab results include: Initial troponin 8, D-dimer 0.27, lipase 47, CBC without a leukocytosis or anemia, CMP unremarkable with stable serum creatinine from previous measurements at 1.36.  Discomfort has been going on for the last 3 to 4 days.  Do not think a repeat troponin is indicated with initial negative troponin.    PERC positive. Well's score, low  probability. D-dimer negative. Labs unremarkable.  Unlikely pneumonia, no cough, no leukocytosis, no fevers, CXR and exam without acute findings. Unlikely pneumothorax, no findings on  CXR. Unlikely pericarditis/myocarditis, does not fit clinical picture. Chest pain not exertional.  Chest pain is worse with movement and chest wall expansion, with reproducible tenderness to palpation on exam.  Unlikely dissection, no pulse deficit, no tearing chest pain, no neurologic complaints.   Imaging results include: No focal abnormality, no acute cardiopulmonary process  Course of tx has consisted of: She administered 800 mg of ibuprofen for chest wall musculoskeletal discomfort, advised to follow-up outpatient with cardiology and his PCP for cardiac stress testing.  Final Clinical Impression(s) / ED Diagnoses Final diagnoses:  Chest wall pain    Rx / DC Orders ED Discharge Orders          Ordered    Ambulatory referral to Cardiology       Comments: If you have not heard from the Cardiology office within the next 72 hours please call [phone].   04/24/22 [id number]              Regan Lemming, MD 04/24/22 [id number]    Regan Lemming, MD 04/24/22 0840

## 2022-04-24 NOTE — ED Triage Notes (Signed)
Here by POV from home for CP, onset 3-4d ago, worse last night with episode of light headedness and diaphoresis, describes as worse with movement and positioning. 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

## 2022-04-24 NOTE — ED Notes (Signed)
To xray via w/c.

## 2022-04-24 NOTE — Discharge Instructions (Addendum)
Recommend follow-up with your PCP and/or cardiology for consideration for outpatient stress testing.  Your EKG, chest x-ray and laboratory evaluation today was reassuring.  Your symptoms are more consistent with chest wall pain and your risk score is low risk from a cardiac standpoint.

## 2022-09-16 ENCOUNTER — Emergency Department (HOSPITAL_BASED_OUTPATIENT_CLINIC_OR_DEPARTMENT_OTHER): Payer: Self-pay

## 2022-09-16 ENCOUNTER — Encounter (HOSPITAL_BASED_OUTPATIENT_CLINIC_OR_DEPARTMENT_OTHER): Payer: Self-pay

## 2022-09-16 ENCOUNTER — Emergency Department (HOSPITAL_BASED_OUTPATIENT_CLINIC_OR_DEPARTMENT_OTHER)
Admission: EM | Admit: 2022-09-16 | Discharge: 2022-09-16 | Disposition: A | Discharge status: Home / Self Care | Payer: Self-pay | Attending: Emergency Medicine | Admitting: Emergency Medicine

## 2022-09-16 ENCOUNTER — Other Ambulatory Visit: Payer: Self-pay

## 2022-09-16 DIAGNOSIS — I10 Essential (primary) hypertension: Secondary | ICD-10-CM | POA: Insufficient documentation

## 2022-09-16 DIAGNOSIS — R253 Fasciculation: Secondary | ICD-10-CM | POA: Insufficient documentation

## 2022-09-16 DIAGNOSIS — R202 Paresthesia of skin: Secondary | ICD-10-CM | POA: Insufficient documentation

## 2022-09-16 LAB — I-STAT CHEM 8, ED
BUN: 13 mg/dL (ref 6–20)
Calcium, Ion: 1.23 mmol/L (ref 1.15–1.40)
Chloride: 104 mmol/L (ref 98–111)
Creatinine, Ser: 1.3 mg/dL — ABNORMAL HIGH (ref 0.61–1.24)
Glucose, Bld: 135 mg/dL — ABNORMAL HIGH (ref 70–99)
HCT: 47 % (ref 39.0–52.0)
Hemoglobin: 16 g/dL (ref 13.0–17.0)
Potassium: 4.4 mmol/L (ref 3.5–5.1)
Sodium: 140 mmol/L (ref 135–145)
TCO2: 24 mmol/L (ref 22–32)

## 2022-09-16 LAB — CBC WITH DIFFERENTIAL/PLATELET
Abs Immature Granulocytes: 0 10*3/uL (ref 0.00–0.07)
Basophils Absolute: 0 10*3/uL (ref 0.0–0.1)
Basophils Relative: 1 %
Eosinophils Absolute: 0.1 10*3/uL (ref 0.0–0.5)
Eosinophils Relative: 2 %
HCT: 43.6 % (ref 39.0–52.0)
Hemoglobin: 13.5 g/dL (ref 13.0–17.0)
Immature Granulocytes: 0 %
Lymphocytes Relative: 51 %
Lymphs Abs: 2.1 10*3/uL (ref 0.7–4.0)
MCH: 22.1 pg — ABNORMAL LOW (ref 26.0–34.0)
MCHC: 31 g/dL (ref 30.0–36.0)
MCV: 71.2 fL — ABNORMAL LOW (ref 80.0–100.0)
Monocytes Absolute: 0.4 10*3/uL (ref 0.1–1.0)
Monocytes Relative: 9 %
Neutro Abs: 1.6 10*3/uL — ABNORMAL LOW (ref 1.7–7.7)
Neutrophils Relative %: 37 %
Platelets: 181 10*3/uL (ref 150–400)
RBC: 6.12 MIL/uL — ABNORMAL HIGH (ref 4.22–5.81)
RDW: 14.4 % (ref 11.5–15.5)
WBC: 4.2 10*3/uL (ref 4.0–10.5)
nRBC: 0 % (ref 0.0–0.2)

## 2022-09-16 MED ORDER — CHLORTHALIDONE 25 MG PO TABS: 25.0000 mg | ORAL_TABLET | Freq: Every day | ORAL | 2 refills | Status: AC

## 2022-09-16 NOTE — ED Triage Notes (Addendum)
Patient noticed twitching to his left eye that started three weeks ago. It has now moved down into his cheek. At the same time he stated it felt like the room is spinning. Then today its twitching down the left side of his body. No facial droop. He is concerned about MS.

## 2022-09-16 NOTE — ED Provider Notes (Signed)
[facility] Provider Note   CSN: [id number] Arrival date & time: 09/16/22  1024     History  Chief Complaint  Patient presents with   Eye Twitching   Spasms    Paul Mercer is a 57 y.o. male.  Patient with history of hypertension, no current PCP presents to the emergency department today for evaluation of several symptoms.  Patient states that starting about 3 weeks ago he developed an intermittent twitching of his left eye and face.  Initially this started as just a fasciculation of his eyelid but then worsened to involve his cheek.  Symptoms do resolve when he is more relaxed.  Symptoms then progressed to the point where he has twitching in his left calf as well as weakness and tingling in his bilateral hands.  He describes the sensation as "lightning shocks" in his hands.  He has never had had similar symptoms.  No severe headache.  No fevers or URI symptoms.  No recent exposures.  No vomiting or diarrhea.  He was concerned about MS because a friend said that he had a family member with similar symptoms and was diagnosed with this. Patient denies signs of stroke including: facial droop, slurred speech, aphasia, weakness/numbness in extremities, imbalance/trouble walking.        Home Medications Prior to Admission medications   Medication Sig Start Date End Date Taking? Authorizing Provider  chlorthalidone (HYGROTON) 25 MG tablet Take 25 mg by mouth daily.    [provider]  ibuprofen (ADVIL) 800 MG tablet Take 1 tablet (800 mg total) by mouth 3 (three) times daily. 04/24/22   Ernie Avena, MD      Allergies    Patient has no known allergies.    Review of Systems   Review of Systems  Physical Exam Updated Vital Signs BP (!) 177/102   Pulse 98   Temp 98.1 F (36.7 C) (Oral)   Resp 18   Ht 5\' 10"  (1.778 m)   Wt 104.3 kg   SpO2 99%   BMI 33.00 kg/m   Physical Exam Vitals and nursing note reviewed.   Constitutional:      Appearance: He is well-developed.  HENT:     Head: Normocephalic and atraumatic.     Right Ear: External ear normal.     Left Ear: External ear normal.     Nose: Nose normal.     Mouth/Throat:     Mouth: Mucous membranes are moist.     Pharynx: Uvula midline.  Eyes:     General: Lids are normal.     Conjunctiva/sclera: Conjunctivae normal.     Pupils: Pupils are equal, round, and reactive to light.  Cardiovascular:     Rate and Rhythm: Normal rate and regular rhythm.  Pulmonary:     Effort: Pulmonary effort is normal.     Breath sounds: Normal breath sounds.  Abdominal:     Palpations: Abdomen is soft.     Tenderness: There is no abdominal tenderness.  Musculoskeletal:        General: Normal range of motion.     Cervical back: Normal range of motion and neck supple. No tenderness or bony tenderness.  Skin:    General: Skin is warm and dry.  Neurological:     Mental Status: He is alert and oriented to person, place, and time.     GCS: GCS eye subscore is 4. GCS verbal subscore is 5. GCS motor subscore is 6.  Cranial Nerves: No cranial nerve deficit.     Sensory: No sensory deficit.     Motor: No abnormal muscle tone.     Coordination: Coordination normal.     Gait: Gait normal.     Comments: Patient with left eye and cheek fasciculations at times during exam.  No lower extremity twitching.  Strength is normal in upper and lower extremities bilaterally.  He ambulates without any imbalance or other difficulties.     ED Results / Procedures / Treatments   Labs (all labs ordered are listed, but only abnormal results are displayed) Labs Reviewed  CBC WITH DIFFERENTIAL/PLATELET - Abnormal; Notable for the following components:      Result Value   RBC 6.12 (*)    MCV 71.2 (*)    MCH 22.1 (*)    Neutro Abs 1.6 (*)    All other components within normal limits  I-STAT CHEM 8, ED - Abnormal; Notable for the following components:   Creatinine, Ser 1.30  (*)    Glucose, Bld 135 (*)    All other components within normal limits    EKG None  Radiology CT Head Wo Contrast  Result Date: 09/16/2022 CLINICAL DATA:  57 year old male with history of new onset of headache. Left leg twitching. Left-sided facial twitching. Bilateral hand paresthesias. EXAM: CT HEAD WITHOUT CONTRAST TECHNIQUE: Contiguous axial images were obtained from the base of the skull through the vertex without intravenous contrast. RADIATION DOSE REDUCTION: This exam was performed according to the departmental dose-optimization program which includes automated exposure control, adjustment of the mA and/or kV according to patient size and/or use of iterative reconstruction technique. COMPARISON:  No priors. FINDINGS: Brain: Cavum septum pellucidum (normal anatomical variant) incidentally noted. No evidence of acute infarction, hemorrhage, hydrocephalus, extra-axial collection or mass lesion/mass effect. Vascular: No hyperdense vessel or unexpected calcification. Skull: Normal. Negative for fracture or focal lesion. Sinuses/Orbits: No acute finding. Other: None. IMPRESSION: 1. No acute intracranial abnormalities to account for the patient's symptoms. The appearance of the brain is normal. Electronically Signed   By: Trudie Reed M.D.   On: 09/16/2022 11:50    Procedures Procedures    Medications Ordered in ED Medications - No data to display  ED Course/ Medical Decision Making/ A&P    Patient seen and examined. History obtained directly from patient.   Labs/EKG: Ordered CBC, BMP.  Imaging: Ordered noncontrast CT of the.  Medications/Fluids: Ordered: None ordered  Most recent vital signs reviewed and are as follows: BP (!) 177/102   Pulse 98   Temp 98.1 F (36.7 C) (Oral)   Resp 18   Ht 5\' 10"  (1.778 m)   Wt 104.3 kg   SpO2 99%   BMI 33.00 kg/m   Initial impression: Fasciculations and paresthesias, hypertension.   12:36 PM Reassessment performed. Patient  appears stable.  Labs personally reviewed and interpreted including: CBC unremarkable; BMP with mildly elevated creatinine but stable from earlier in the year.  Imaging personally visualized and interpreted including: CT head without contrast, agree no acute findings  Reviewed pertinent lab work and imaging with patient at bedside. Questions answered.   Most current vital signs reviewed and are as follows: BP (!) 177/102   Pulse 98   Temp 98.1 F (36.7 C) (Oral)   Resp 18   Ht 5\' 10"  (1.778 m)   Wt 104.3 kg   SpO2 99%   BMI 33.00 kg/m   Plan: Discharge to home.   Prescriptions written for: Refill of chlorthalidone  for elevated blood pressure as patient is now out  Other home care instructions discussed: Close monitoring of symptoms  ED return instructions discussed: Patient counseled to return if they have weakness in their arms or legs, slurred speech, trouble walking or talking, confusion, trouble with their balance, or if they have any other concerns. Patient verbalizes understanding and agrees with plan.   Follow-up instructions discussed: Patient encouraged to follow-up with neurology referral in the next 1-2 weeks.                             Medical Decision Making Amount and/or Complexity of Data Reviewed Labs: ordered. Radiology: ordered.   Patient seen and evaluated today for several weeks of left-sided muscle fasciculations and bilateral hand paresthesias.  He does not have any focal neurodeficits on exam today.  I have low concern for stroke.  Head CT is limited but did not show any gross abnormality and lab work is stable.  He does have uncontrolled hypertension.  At this point, no indication for further inpatient workup or transfer for advanced imaging.  He would likely benefit however from outpatient neurology follow-up for consideration of other testing.  He is willing to do this.  Referral placed.  We did discuss signs symptoms of stroke and need to return if  these occur.  He seems reliable to do so.  The patient's vital signs, pertinent lab work and imaging were reviewed and interpreted as discussed in the ED course. Hospitalization was considered for further testing, treatments, or serial exams/observation. However as patient is well-appearing, has a stable exam, and reassuring studies today, I do not feel that they warrant admission at this time. This plan was discussed with the patient who verbalizes agreement and comfort with this plan and seems reliable and able to return to the Emergency Department with worsening or changing symptoms.          Final Clinical Impression(s) / ED Diagnoses Final diagnoses:  Muscle fasciculation  Paresthesia  Uncontrolled hypertension    Rx / DC Orders ED Discharge Orders          Ordered    chlorthalidone (HYGROTON) 25 MG tablet  Daily        09/16/22 1233    Ambulatory referral to Neurology       Comments: An appointment is requested in approximately: 2 weeks for new onset, left-sided muscle fasciculations and paresthesias in the bilateral hands   09/16/22 1234              Renne Crigler, PA-C 09/16/22 1239    Terrilee Files, MD 09/16/22 1754

## 2022-09-16 NOTE — Discharge Instructions (Signed)
Please read and follow all provided instructions.  Your diagnoses today include:  1. Muscle fasciculation   2. Paresthesia   3. Uncontrolled hypertension     Tests performed today include: CT of your head which was normal and did not show any serious cause of your symptoms today Complete blood cell count:  Basic metabolic panel: Shows slightly weak kidney function but stable from earlier in the year Vital signs. See below for your results today.   Medications:  None ordered  Take any prescribed medications only as directed.  Additional information:  Follow any educational materials contained in this packet.  Follow-up instructions: Please follow-up with neurology as planned.  You should receive a call in regards to an appointment as a referral was placed for you today.  Please follow-up with the primary care doctor as well when you are able.  This will be important to manage your blood pressure long-term.  Return instructions:  Please return to the Emergency Department if you experience worsening symptoms. Return if the medications do not resolve your headache, if it recurs, or if you have multiple episodes of vomiting or cannot keep down fluids. Return if you have a change from the usual headache. RETURN IMMEDIATELY IF you: Develop a sudden, severe headache Develop confusion or become poorly responsive or faint Develop a fever above 100.50F or problem breathing Have a change in speech, vision, swallowing, or understanding Develop new weakness, numbness, tingling, incoordination in your arms or legs Have a seizure Please return if you have any other emergent concerns.  Additional Information:  Your vital signs today were: BP (!) 177/102   Pulse 98   Temp 98.1 F (36.7 C) (Oral)   Resp 18   Ht 5\' 10"  (1.778 m)   Wt 104.3 kg   SpO2 99%   BMI 33.00 kg/m  If your blood pressure (BP) was elevated above 135/85 this visit, please have this repeated by your doctor within one  month. --------------

## 2022-12-11 ENCOUNTER — Ambulatory Visit: Payer: Self-pay | Admitting: Neurology

## 2022-12-11 ENCOUNTER — Encounter: Payer: Self-pay | Admitting: Neurology
